# Patient Record
Sex: Female | Born: 1960 | Race: White | Hispanic: No | Marital: Married | Smoking: Never smoker
Health system: Southern US, Community
[De-identification: ages and names within clinical notes are randomized; demographics above are authoritative.]

## PROBLEM LIST (undated history)

## (undated) DIAGNOSIS — M810 Age-related osteoporosis without current pathological fracture: Secondary | ICD-10-CM

## (undated) DIAGNOSIS — F419 Anxiety disorder, unspecified: Secondary | ICD-10-CM

## (undated) DIAGNOSIS — J302 Other seasonal allergic rhinitis: Secondary | ICD-10-CM

## (undated) DIAGNOSIS — K219 Gastro-esophageal reflux disease without esophagitis: Secondary | ICD-10-CM

## (undated) HISTORY — DX: Other seasonal allergic rhinitis: J30.2

## (undated) HISTORY — DX: Age-related osteoporosis without current pathological fracture: M81.0

## (undated) HISTORY — DX: Anxiety disorder, unspecified: F41.9

## (undated) HISTORY — DX: Gastro-esophageal reflux disease without esophagitis: K21.9

---

## 1995-06-09 HISTORY — PX: TUBAL LIGATION: SHX77

## 1998-01-29 ENCOUNTER — Other Ambulatory Visit: Admission: RE | Admit: 1998-01-29 | Discharge: 1998-01-29 | Payer: Self-pay | Admitting: *Deleted

## 1999-02-03 ENCOUNTER — Other Ambulatory Visit: Admission: RE | Admit: 1999-02-03 | Discharge: 1999-02-03 | Payer: Self-pay | Admitting: *Deleted

## 2000-02-10 ENCOUNTER — Other Ambulatory Visit: Admission: RE | Admit: 2000-02-10 | Discharge: 2000-02-10 | Payer: Self-pay | Admitting: *Deleted

## 2001-02-10 ENCOUNTER — Other Ambulatory Visit: Admission: RE | Admit: 2001-02-10 | Discharge: 2001-02-10 | Payer: Self-pay | Admitting: *Deleted

## 2002-03-10 ENCOUNTER — Other Ambulatory Visit: Admission: RE | Admit: 2002-03-10 | Discharge: 2002-03-10 | Payer: Self-pay | Admitting: Obstetrics and Gynecology

## 2002-03-30 ENCOUNTER — Encounter: Payer: Self-pay | Admitting: Obstetrics and Gynecology

## 2002-03-30 ENCOUNTER — Ambulatory Visit (HOSPITAL_COMMUNITY): Admission: RE | Admit: 2002-03-30 | Discharge: 2002-03-30 | Payer: Self-pay | Admitting: Obstetrics and Gynecology

## 2003-04-02 ENCOUNTER — Encounter: Payer: Self-pay | Admitting: Obstetrics and Gynecology

## 2003-04-02 ENCOUNTER — Ambulatory Visit (HOSPITAL_COMMUNITY): Admission: RE | Admit: 2003-04-02 | Discharge: 2003-04-02 | Payer: Self-pay | Admitting: Obstetrics and Gynecology

## 2003-04-12 ENCOUNTER — Other Ambulatory Visit: Admission: RE | Admit: 2003-04-12 | Discharge: 2003-04-12 | Payer: Self-pay | Admitting: Obstetrics and Gynecology

## 2004-05-05 ENCOUNTER — Ambulatory Visit (HOSPITAL_COMMUNITY): Admission: RE | Admit: 2004-05-05 | Discharge: 2004-05-05 | Payer: Self-pay | Admitting: Obstetrics and Gynecology

## 2004-05-16 ENCOUNTER — Encounter: Admission: RE | Admit: 2004-05-16 | Discharge: 2004-05-16 | Payer: Self-pay | Admitting: Obstetrics and Gynecology

## 2005-06-12 ENCOUNTER — Ambulatory Visit (HOSPITAL_COMMUNITY): Admission: RE | Admit: 2005-06-12 | Discharge: 2005-06-12 | Payer: Self-pay | Admitting: Obstetrics and Gynecology

## 2006-06-14 ENCOUNTER — Ambulatory Visit (HOSPITAL_COMMUNITY): Admission: RE | Admit: 2006-06-14 | Discharge: 2006-06-14 | Payer: Self-pay | Admitting: Obstetrics and Gynecology

## 2007-06-23 ENCOUNTER — Encounter: Admission: RE | Admit: 2007-06-23 | Discharge: 2007-06-23 | Payer: Self-pay | Admitting: Obstetrics and Gynecology

## 2008-06-25 ENCOUNTER — Ambulatory Visit (HOSPITAL_BASED_OUTPATIENT_CLINIC_OR_DEPARTMENT_OTHER): Admission: RE | Admit: 2008-06-25 | Discharge: 2008-06-25 | Payer: Self-pay | Admitting: Obstetrics and Gynecology

## 2008-06-26 ENCOUNTER — Ambulatory Visit: Payer: Self-pay | Admitting: Diagnostic Radiology

## 2009-06-27 ENCOUNTER — Ambulatory Visit (HOSPITAL_BASED_OUTPATIENT_CLINIC_OR_DEPARTMENT_OTHER)
Admission: RE | Admit: 2009-06-27 | Discharge: 2009-06-27 | Payer: Self-pay | Source: Home / Self Care | Admitting: Obstetrics and Gynecology

## 2009-06-27 ENCOUNTER — Ambulatory Visit: Payer: Self-pay | Admitting: Diagnostic Radiology

## 2010-06-28 ENCOUNTER — Encounter: Payer: Self-pay | Admitting: Obstetrics and Gynecology

## 2010-07-03 ENCOUNTER — Ambulatory Visit (INDEPENDENT_AMBULATORY_CARE_PROVIDER_SITE_OTHER)
Admission: RE | Admit: 2010-07-03 | Discharge: 2010-07-03 | Payer: Self-pay | Source: Home / Self Care | Attending: Obstetrics and Gynecology | Admitting: Obstetrics and Gynecology

## 2010-07-03 DIAGNOSIS — Z1231 Encounter for screening mammogram for malignant neoplasm of breast: Secondary | ICD-10-CM

## 2011-06-17 ENCOUNTER — Other Ambulatory Visit (HOSPITAL_BASED_OUTPATIENT_CLINIC_OR_DEPARTMENT_OTHER): Payer: Self-pay | Admitting: Obstetrics and Gynecology

## 2011-06-17 DIAGNOSIS — Z1231 Encounter for screening mammogram for malignant neoplasm of breast: Secondary | ICD-10-CM

## 2011-07-08 ENCOUNTER — Ambulatory Visit (HOSPITAL_BASED_OUTPATIENT_CLINIC_OR_DEPARTMENT_OTHER)
Admission: RE | Admit: 2011-07-08 | Discharge: 2011-07-08 | Disposition: A | Discharge status: Home / Self Care | Payer: BC Managed Care – PPO | Source: Ambulatory Visit | Attending: Obstetrics and Gynecology | Admitting: Obstetrics and Gynecology

## 2011-07-08 DIAGNOSIS — Z1231 Encounter for screening mammogram for malignant neoplasm of breast: Secondary | ICD-10-CM

## 2012-06-07 ENCOUNTER — Other Ambulatory Visit (HOSPITAL_BASED_OUTPATIENT_CLINIC_OR_DEPARTMENT_OTHER): Payer: Self-pay | Admitting: Family Medicine

## 2012-06-07 DIAGNOSIS — Z1231 Encounter for screening mammogram for malignant neoplasm of breast: Secondary | ICD-10-CM

## 2012-07-08 ENCOUNTER — Ambulatory Visit (HOSPITAL_BASED_OUTPATIENT_CLINIC_OR_DEPARTMENT_OTHER)
Admission: RE | Admit: 2012-07-08 | Discharge: 2012-07-08 | Disposition: A | Discharge status: Home / Self Care | Payer: BC Managed Care – PPO | Source: Ambulatory Visit | Attending: Family Medicine | Admitting: Family Medicine

## 2012-07-08 DIAGNOSIS — Z1231 Encounter for screening mammogram for malignant neoplasm of breast: Secondary | ICD-10-CM

## 2013-06-19 ENCOUNTER — Other Ambulatory Visit (HOSPITAL_BASED_OUTPATIENT_CLINIC_OR_DEPARTMENT_OTHER): Payer: Self-pay | Admitting: Family Medicine

## 2013-06-19 DIAGNOSIS — Z1231 Encounter for screening mammogram for malignant neoplasm of breast: Secondary | ICD-10-CM

## 2013-07-12 ENCOUNTER — Ambulatory Visit (HOSPITAL_BASED_OUTPATIENT_CLINIC_OR_DEPARTMENT_OTHER)
Admission: RE | Admit: 2013-07-12 | Discharge: 2013-07-12 | Disposition: A | Discharge status: Home / Self Care | Payer: BC Managed Care – PPO | Source: Ambulatory Visit | Attending: Family Medicine | Admitting: Family Medicine

## 2013-07-12 DIAGNOSIS — Z1231 Encounter for screening mammogram for malignant neoplasm of breast: Secondary | ICD-10-CM | POA: Insufficient documentation

## 2014-06-12 ENCOUNTER — Other Ambulatory Visit (HOSPITAL_BASED_OUTPATIENT_CLINIC_OR_DEPARTMENT_OTHER): Payer: Self-pay | Admitting: Family Medicine

## 2014-06-12 DIAGNOSIS — Z1231 Encounter for screening mammogram for malignant neoplasm of breast: Secondary | ICD-10-CM

## 2014-07-27 ENCOUNTER — Ambulatory Visit (HOSPITAL_BASED_OUTPATIENT_CLINIC_OR_DEPARTMENT_OTHER)
Admission: RE | Admit: 2014-07-27 | Discharge: 2014-07-27 | Disposition: A | Discharge status: Home / Self Care | Payer: BLUE CROSS/BLUE SHIELD | Source: Ambulatory Visit | Attending: Family Medicine | Admitting: Family Medicine

## 2014-07-27 DIAGNOSIS — Z1231 Encounter for screening mammogram for malignant neoplasm of breast: Secondary | ICD-10-CM | POA: Diagnosis present

## 2014-07-27 DIAGNOSIS — R928 Other abnormal and inconclusive findings on diagnostic imaging of breast: Secondary | ICD-10-CM | POA: Diagnosis not present

## 2014-07-30 ENCOUNTER — Other Ambulatory Visit: Payer: Self-pay | Admitting: Family Medicine

## 2014-07-30 DIAGNOSIS — R928 Other abnormal and inconclusive findings on diagnostic imaging of breast: Secondary | ICD-10-CM

## 2014-08-06 ENCOUNTER — Other Ambulatory Visit: Payer: Self-pay | Admitting: Family Medicine

## 2014-08-06 ENCOUNTER — Ambulatory Visit
Admission: RE | Admit: 2014-08-06 | Discharge: 2014-08-06 | Disposition: A | Discharge status: Home / Self Care | Payer: BLUE CROSS/BLUE SHIELD | Source: Ambulatory Visit | Attending: Family Medicine | Admitting: Family Medicine

## 2014-08-06 DIAGNOSIS — R928 Other abnormal and inconclusive findings on diagnostic imaging of breast: Secondary | ICD-10-CM

## 2015-01-02 ENCOUNTER — Other Ambulatory Visit: Payer: Self-pay | Admitting: Family Medicine

## 2015-01-02 DIAGNOSIS — N6489 Other specified disorders of breast: Secondary | ICD-10-CM

## 2015-02-05 ENCOUNTER — Other Ambulatory Visit: Payer: BLUE CROSS/BLUE SHIELD

## 2015-03-12 ENCOUNTER — Ambulatory Visit
Admission: RE | Admit: 2015-03-12 | Discharge: 2015-03-12 | Disposition: A | Discharge status: Home / Self Care | Payer: BLUE CROSS/BLUE SHIELD | Source: Ambulatory Visit | Attending: Family Medicine | Admitting: Family Medicine

## 2015-03-12 DIAGNOSIS — N6489 Other specified disorders of breast: Secondary | ICD-10-CM

## 2015-07-23 ENCOUNTER — Other Ambulatory Visit (HOSPITAL_BASED_OUTPATIENT_CLINIC_OR_DEPARTMENT_OTHER): Payer: Self-pay | Admitting: Obstetrics and Gynecology

## 2015-07-23 DIAGNOSIS — Z1231 Encounter for screening mammogram for malignant neoplasm of breast: Secondary | ICD-10-CM

## 2015-07-30 ENCOUNTER — Ambulatory Visit (HOSPITAL_BASED_OUTPATIENT_CLINIC_OR_DEPARTMENT_OTHER)
Admission: RE | Admit: 2015-07-30 | Discharge: 2015-07-30 | Disposition: A | Discharge status: Home / Self Care | Payer: BLUE CROSS/BLUE SHIELD | Source: Ambulatory Visit | Attending: Obstetrics and Gynecology | Admitting: Obstetrics and Gynecology

## 2015-07-30 DIAGNOSIS — Z1231 Encounter for screening mammogram for malignant neoplasm of breast: Secondary | ICD-10-CM | POA: Diagnosis not present

## 2016-06-30 ENCOUNTER — Other Ambulatory Visit (HOSPITAL_BASED_OUTPATIENT_CLINIC_OR_DEPARTMENT_OTHER): Payer: Self-pay | Admitting: Obstetrics and Gynecology

## 2016-06-30 DIAGNOSIS — Z1231 Encounter for screening mammogram for malignant neoplasm of breast: Secondary | ICD-10-CM

## 2016-07-30 ENCOUNTER — Ambulatory Visit (HOSPITAL_BASED_OUTPATIENT_CLINIC_OR_DEPARTMENT_OTHER)
Admission: RE | Admit: 2016-07-30 | Discharge: 2016-07-30 | Disposition: A | Discharge status: Home / Self Care | Payer: BLUE CROSS/BLUE SHIELD | Source: Ambulatory Visit | Attending: Obstetrics and Gynecology | Admitting: Obstetrics and Gynecology

## 2016-07-30 ENCOUNTER — Encounter (HOSPITAL_BASED_OUTPATIENT_CLINIC_OR_DEPARTMENT_OTHER): Payer: Self-pay

## 2016-07-30 DIAGNOSIS — R928 Other abnormal and inconclusive findings on diagnostic imaging of breast: Secondary | ICD-10-CM | POA: Insufficient documentation

## 2016-07-30 DIAGNOSIS — Z1231 Encounter for screening mammogram for malignant neoplasm of breast: Secondary | ICD-10-CM | POA: Diagnosis present

## 2016-08-04 ENCOUNTER — Other Ambulatory Visit: Payer: Self-pay | Admitting: Obstetrics and Gynecology

## 2016-08-04 DIAGNOSIS — R928 Other abnormal and inconclusive findings on diagnostic imaging of breast: Secondary | ICD-10-CM

## 2016-08-06 ENCOUNTER — Ambulatory Visit
Admission: RE | Admit: 2016-08-06 | Discharge: 2016-08-06 | Disposition: A | Discharge status: Home / Self Care | Payer: BLUE CROSS/BLUE SHIELD | Source: Ambulatory Visit | Attending: Obstetrics and Gynecology | Admitting: Obstetrics and Gynecology

## 2016-08-06 DIAGNOSIS — R928 Other abnormal and inconclusive findings on diagnostic imaging of breast: Secondary | ICD-10-CM

## 2017-08-09 ENCOUNTER — Other Ambulatory Visit: Payer: Self-pay | Admitting: Obstetrics and Gynecology

## 2017-08-09 DIAGNOSIS — Z1231 Encounter for screening mammogram for malignant neoplasm of breast: Secondary | ICD-10-CM

## 2017-08-26 ENCOUNTER — Ambulatory Visit
Admission: RE | Admit: 2017-08-26 | Discharge: 2017-08-26 | Disposition: A | Discharge status: Home / Self Care | Payer: BLUE CROSS/BLUE SHIELD | Source: Ambulatory Visit | Attending: Obstetrics and Gynecology | Admitting: Obstetrics and Gynecology

## 2017-08-26 DIAGNOSIS — Z1231 Encounter for screening mammogram for malignant neoplasm of breast: Secondary | ICD-10-CM

## 2018-07-26 ENCOUNTER — Other Ambulatory Visit: Payer: Self-pay | Admitting: Obstetrics and Gynecology

## 2018-07-26 DIAGNOSIS — Z1231 Encounter for screening mammogram for malignant neoplasm of breast: Secondary | ICD-10-CM

## 2018-08-30 ENCOUNTER — Ambulatory Visit: Payer: BLUE CROSS/BLUE SHIELD

## 2018-09-28 ENCOUNTER — Ambulatory Visit: Payer: BLUE CROSS/BLUE SHIELD

## 2018-11-24 ENCOUNTER — Other Ambulatory Visit: Payer: Self-pay

## 2018-11-24 ENCOUNTER — Ambulatory Visit
Admission: RE | Admit: 2018-11-24 | Discharge: 2018-11-24 | Disposition: A | Discharge status: Home / Self Care | Payer: BC Managed Care – PPO | Source: Ambulatory Visit | Attending: Obstetrics and Gynecology | Admitting: Obstetrics and Gynecology

## 2018-11-24 DIAGNOSIS — Z1231 Encounter for screening mammogram for malignant neoplasm of breast: Secondary | ICD-10-CM

## 2019-09-02 ENCOUNTER — Ambulatory Visit: Payer: Self-pay | Attending: Internal Medicine

## 2019-09-02 DIAGNOSIS — Z23 Encounter for immunization: Secondary | ICD-10-CM

## 2019-09-02 NOTE — Progress Notes (Signed)
   Covid-19 Vaccination Clinic  Name:  Samantha Wiley    MRN: [id number] DOB: 05/07/1961  09/02/2019  [name] was observed post Covid-19 immunization for 15 minutes without incident. She was provided with Vaccine Information Sheet and instruction to access the V-Safe system.   [name] was instructed to call 911 with any severe reactions post vaccine: Marland Kitchen Difficulty breathing  . Swelling of face and throat  . A fast heartbeat  . A bad rash all over body  . Dizziness and weakness   Immunizations Administered    Name Date Dose VIS Date Route   Pfizer COVID-19 Vaccine 09/02/2019  9:44 AM 0.3 mL 05/19/2019 Intramuscular   Manufacturer: [place]   Lot: [id number]   [place]: [id number]

## 2019-09-21 ENCOUNTER — Other Ambulatory Visit: Payer: Self-pay | Admitting: Obstetrics and Gynecology

## 2019-09-21 DIAGNOSIS — E2839 Other primary ovarian failure: Secondary | ICD-10-CM

## 2019-09-26 ENCOUNTER — Ambulatory Visit: Payer: Self-pay | Attending: Internal Medicine

## 2019-09-26 DIAGNOSIS — Z23 Encounter for immunization: Secondary | ICD-10-CM

## 2019-09-26 NOTE — Progress Notes (Signed)
   Covid-19 Vaccination Clinic  Name:  Ailana Ganske    MRN: [id number] DOB: August 26, 1960  09/26/2019  Ms. Giuffrida was observed post Covid-19 immunization for 15 minutes without incident. She was provided with Vaccine Information Sheet and instruction to access the V-Safe system.   Ms. Highland was instructed to call 911 with any severe reactions post vaccine: Marland Kitchen Difficulty breathing  . Swelling of face and throat  . A fast heartbeat  . A bad rash all over body  . Dizziness and weakness   Immunizations Administered    Name Date Dose VIS Date Route   Pfizer COVID-19 Vaccine 09/26/2019  4:11 PM 0.3 mL 08/02/2018 Intramuscular   Manufacturer: [place]   Lot: [id number]   [place]: [id number]

## 2019-10-09 ENCOUNTER — Other Ambulatory Visit: Payer: Self-pay | Admitting: Obstetrics and Gynecology

## 2019-10-09 DIAGNOSIS — Z1231 Encounter for screening mammogram for malignant neoplasm of breast: Secondary | ICD-10-CM

## 2019-12-18 ENCOUNTER — Other Ambulatory Visit: Payer: Self-pay

## 2019-12-18 ENCOUNTER — Ambulatory Visit
Admission: RE | Admit: 2019-12-18 | Discharge: 2019-12-18 | Disposition: A | Discharge status: Home / Self Care | Payer: BC Managed Care – PPO | Source: Ambulatory Visit | Attending: Obstetrics and Gynecology | Admitting: Obstetrics and Gynecology

## 2019-12-18 DIAGNOSIS — Z1231 Encounter for screening mammogram for malignant neoplasm of breast: Secondary | ICD-10-CM

## 2019-12-18 DIAGNOSIS — E2839 Other primary ovarian failure: Secondary | ICD-10-CM

## 2020-11-27 ENCOUNTER — Other Ambulatory Visit: Payer: Self-pay | Admitting: Family Medicine

## 2020-11-27 DIAGNOSIS — Z1231 Encounter for screening mammogram for malignant neoplasm of breast: Secondary | ICD-10-CM

## 2021-01-20 ENCOUNTER — Ambulatory Visit
Admission: RE | Admit: 2021-01-20 | Discharge: 2021-01-20 | Disposition: A | Discharge status: Home / Self Care | Payer: BLUE CROSS/BLUE SHIELD | Source: Ambulatory Visit | Attending: Family Medicine | Admitting: Family Medicine

## 2021-01-20 ENCOUNTER — Other Ambulatory Visit: Payer: Self-pay

## 2021-01-20 DIAGNOSIS — Z1231 Encounter for screening mammogram for malignant neoplasm of breast: Secondary | ICD-10-CM

## 2021-09-06 DIAGNOSIS — I639 Cerebral infarction, unspecified: Secondary | ICD-10-CM

## 2021-09-06 HISTORY — DX: Cerebral infarction, unspecified: I63.9

## 2021-11-12 ENCOUNTER — Encounter: Payer: Self-pay | Admitting: Neurology

## 2021-12-25 NOTE — Progress Notes (Signed)
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  She has no history of hypertension and no underlying structural mass lesion or vascular abnormality.  She has been steadily improving.     Subjective:  Samantha Wiley is a 61 year old right-handed female with osteoporosis who presents for stroke.  History supplemented by hospital records and referring provider's notes.  On 09/14/2021, patient was in [place], [place] when she developed headache and speech abnormality.  She has no recollection of the event or the hospital stay.  She was admitted to [facility] where she was found to have a non-traumatic left thalamic/basal ganglia hemorrhage with interventricular hemorrhage requiring intubation an EVD placement and subsequent removal on 4/19.  She has no history of high blood pressure (she actually has low blood pressure) and there is no mention in available notes that she presented with elevated blood pressure.  MRI of brain performed in hospital revealed no underlying lesion but was performed without contrast.  She was discharged on 10/02/2021 to [facility] where she stayed until 10/12/2021.  She followed up with local outpatient neurosurgery on 10/15/2021 a day after repeat head CT which showed unchanged hypodensity in the left thalamus, normal extra-axial spaces and a small calcification along the external ventricular drain tract with right frontal approach but no mass, hemorrhage or midline shift.  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  If she closes her left eye, vision is clear but if she just closes her right eye, still looks a little  blurred.  With eye movements up or side to side, notes double vision.  She saw neuro-ophthalmology who told her that her vision will eventually resolve.  She had a follow up outpatient MRI of brain with and without contrast on 12/18/2021 which showed sequela of prior left thalamic hemorrhage without underlying lesion.  She noticed mild left sided facial tingling in the V2 distribution.  She also continues to feel unsteady on her feet and dizzy.  She is not driving.   09/14/2021 CT HEAD WO:  Left thalamic intraparenchymal hemorrhage with extension into the ventricles.  This extends down to the 4th ventricle with ventricular enlargement. 09/17/2021 MRI BRAIN WO:  Redemonstrated findings from CT, as above. 09/23/2021 CT HEAD WO:  Continued evolution of the left thalamic intraparenchymal hemorrhage.  Resolved subarachnoid hemorrhage.  09/24/2021 CT HEAD WO:  Continued left thalamic intraparenchymal hemorrhagic evolution, similar ventricular size with small volume intraventricular blood.  No hydrocephalus with EVD in position.  No new hemorrhage.  09/29/2021 CT HEAD WO:  Continued left thalamic intraparenchymal hemorrhagic evolusion.  Similar ventricular size status post EVD removal.  No new hemorrhage or other acute interval finding. 12/18/2021 MRI BRAIN W WO:  1. Sequela of prior left thalamic hemorrhage. No underlying lesion identified. 2. 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   PAST MEDICAL HISTORY: No past medical history on file.   PAST SURGICAL HISTORY: No past surgical history on file.  MEDICATIONS: Current Outpatient Medications on File Prior to Visit  Medication Sig Dispense Refill   Ascorbic Acid (VITAMIN C) 100 MG CHEW      Cholecalciferol (VITAMIN D-1000 MAX ST) 25 MCG (  1000 UT) tablet Take by mouth.     denosumab (PROLIA) 60 MG/ML SOSY injection Inject 60 mg into the skin every 6 (six) months.      fluticasone (FLONASE) 50 MCG/ACT nasal spray Place into both nostrils.     Magnesium 125 MG CAPS      Multiple Vitamins-Minerals (MULTI ADULT GUMMIES) CHEW      omeprazole (PRILOSEC) 20 MG capsule Take 20 mg by mouth daily.     vitamin B-12 (CYANOCOBALAMIN) 1000 MCG tablet Take 1 tablet by mouth daily.     No current facility-administered medications on file prior to visit.     ALLERGIES: No Known Allergies  FAMILY HISTORY: Family History  Problem Relation Age of Onset   Stroke Maternal Grandfather    Stroke Paternal Grandmother     Objective:  Blood pressure 118/70, pulse 81, height '5\' 5"'$  (1.651 m), weight 97 lb 9.6 oz (44.3 kg), SpO2 98 %. General: No acute distress.  Patient appears well-groomed.   Head:  Normocephalic/atraumatic Eyes:  fundi examined but not visualized Neck: supple, no paraspinal tenderness, full range of motion Back: No paraspinal tenderness Heart: regular rate and rhythm Lungs: Clear to auscultation bilaterally. Vascular: No carotid bruits. Neurological Exam: Mental status: alert and oriented to person, place, and time, speech fluent and not dysarthric, language intact. Cranial nerves: CN I: not tested CN II: pupils equal, round and reactive to light, visual fields intact CN III, IV, VI:  full range of motion, no nystagmus, no ptosis CN V: facial sensation intact. CN VII: upper and lower face symmetric CN VIII: hearing intact CN IX, X: gag intact, uvula midline CN XI: sternocleidomastoid and trapezius muscles intact CN XII: tongue midline Bulk & Tone: normal, no fasciculations. Motor:  muscle strength 5/5 throughout Sensation:  Pinprick, temperature and vibratory sensation intact. Deep Tendon Reflexes:  2+ throughout,  toes downgoing.   Finger to nose testing:  Without dysmetria.   Heel to shin:  Without dysmetria.   Gait:  Normal station and stride.  Romberg negative.    Thank you for allowing me to take part in the care of this  patient.  Metta Clines, DO  CC: Blenda Mounts, MD

## 2021-12-29 ENCOUNTER — Encounter: Payer: Self-pay | Admitting: Neurology

## 2021-12-29 ENCOUNTER — Ambulatory Visit: Payer: BC Managed Care – PPO | Admitting: Neurology

## 2021-12-29 VITALS — BP 118/70 | HR 81 | Ht 65.0 in | Wt 97.6 lb

## 2021-12-29 DIAGNOSIS — I619 Nontraumatic intracerebral hemorrhage, unspecified: Secondary | ICD-10-CM

## 2022-03-18 ENCOUNTER — Other Ambulatory Visit: Payer: Self-pay | Admitting: Family Medicine

## 2022-03-18 DIAGNOSIS — Z1231 Encounter for screening mammogram for malignant neoplasm of breast: Secondary | ICD-10-CM

## 2022-04-02 ENCOUNTER — Encounter: Payer: Self-pay | Admitting: Gastroenterology

## 2022-04-16 ENCOUNTER — Ambulatory Visit (AMBULATORY_SURGERY_CENTER): Payer: Self-pay

## 2022-04-16 VITALS — Ht 65.0 in | Wt 98.0 lb

## 2022-04-16 DIAGNOSIS — R195 Other fecal abnormalities: Secondary | ICD-10-CM

## 2022-04-16 DIAGNOSIS — Z1211 Encounter for screening for malignant neoplasm of colon: Secondary | ICD-10-CM

## 2022-04-16 MED ORDER — NA SULFATE-K SULFATE-MG SULF 17.5-3.13-1.6 GM/177ML PO SOLN: 1.0000 | Freq: Once | ORAL | 0 refills | Status: AC

## 2022-04-16 NOTE — Progress Notes (Signed)
No egg or soy allergy known to patient  No issues known to pt with past sedation with any surgeries or procedures Patient denies ever being told they had issues or difficulty with intubation;  No FH of Malignant Hyperthermia; Pt is not on diet pills; Pt is not on home 02;  Pt is not on blood thinners;  Pt reports issues with constipation; patient reports she eats more fruits and veggies and will either take an OTC med to relief of constipation- patient aware she has hemorrhoids; patient advised to increase oral fluids and to take Miralax or stool softeners if needed;  No A fib or A flutter; Have any cardiac testing pending--NO Pt instructed to use Singlecare.com or GoodRx for a price reduction on prep;   Insurance verified during [place] appt=BCBS PPO  Patient's chart reviewed by Osvaldo Angst CNRA prior to previsit and patient appropriate for the [place].  Previsit completed and red dot placed by patient's name on their procedure day (on provider's schedule).    GoodRx coupon given to the patient during [facility] appt;

## 2022-04-22 ENCOUNTER — Ambulatory Visit
Admission: RE | Admit: 2022-04-22 | Discharge: 2022-04-22 | Disposition: A | Discharge status: Home / Self Care | Payer: BC Managed Care – PPO | Source: Ambulatory Visit | Attending: Family Medicine | Admitting: Family Medicine

## 2022-04-22 DIAGNOSIS — Z1231 Encounter for screening mammogram for malignant neoplasm of breast: Secondary | ICD-10-CM

## 2022-04-29 ENCOUNTER — Encounter: Payer: Self-pay | Admitting: Gastroenterology

## 2022-05-03 ENCOUNTER — Encounter: Payer: Self-pay | Admitting: Certified Registered Nurse Anesthetist

## 2022-05-07 ENCOUNTER — Encounter: Payer: Self-pay | Admitting: Gastroenterology

## 2022-05-07 ENCOUNTER — Ambulatory Visit (AMBULATORY_SURGERY_CENTER): Payer: BC Managed Care – PPO | Admitting: Gastroenterology

## 2022-05-07 VITALS — BP 105/70 | HR 75 | Temp 97.8°F | Resp 12 | Ht 65.0 in | Wt 98.0 lb

## 2022-05-07 DIAGNOSIS — D123 Benign neoplasm of transverse colon: Secondary | ICD-10-CM

## 2022-05-07 DIAGNOSIS — D120 Benign neoplasm of cecum: Secondary | ICD-10-CM | POA: Diagnosis not present

## 2022-05-07 DIAGNOSIS — R195 Other fecal abnormalities: Secondary | ICD-10-CM

## 2022-05-07 MED ORDER — SODIUM CHLORIDE 0.9 % IV SOLN: 500.0000 mL | INTRAVENOUS | Status: DC

## 2022-05-07 NOTE — Op Note (Signed)
[place] Patient Name: Samantha Wiley Procedure Date: 05/07/2022 9:32 AM MRN: [id number] Endoscopist: Mallie Mussel L. Loletha Carrow , MD, [id number] Age: 61 Referring MD:  Date of Birth: 12/30/1960 Gender: Female Account #: [id number] Procedure:                Colonoscopy Indications:              Positive Cologuard test Medicines:                Monitored Anesthesia Care Procedure:                Pre-Anesthesia Assessment:                           - Prior to the procedure, a History and Physical                            was performed, and patient medications and                            allergies were reviewed. The patient's tolerance of                            previous anesthesia was also reviewed. The risks                            and benefits of the procedure and the sedation                            options and risks were discussed with the patient.                            All questions were answered, and informed consent                            was obtained. Prior Anticoagulants: The patient has                            taken no anticoagulant or antiplatelet agents. ASA                            Grade Assessment: II - A patient with mild systemic                            disease. After reviewing the risks and benefits,                            the patient was deemed in satisfactory condition to                            undergo the procedure.                           After obtaining informed consent, the colonoscope  was passed under direct vision. Throughout the                            procedure, the patient's blood pressure, pulse, and                            oxygen saturations were monitored continuously. The                            Olympus PCF-H190DL (#[id number]) Colonoscope was                            introduced through the anus and advanced to the the                            cecum, identified by appendiceal  orifice and                            ileocecal valve. The colonoscopy was somewhat                            difficult due to a redundant colon, a tortuous                            colon and the patient's body habitus (slim and                            petite). Successful completion of the procedure was                            aided by using manual pressure and straightening                            and shortening the scope to obtain bowel loop                            reduction. The patient tolerated the procedure                            well. The quality of the bowel preparation was                            excellent. The ileocecal valve, appendiceal                            orifice, and rectum were photographed. Scope In: 9:42:51 AM Scope Out: 10:00:43 AM Scope Withdrawal Time: 0 hours 13 minutes 24 seconds  Total Procedure Duration: 0 hours 17 minutes 52 seconds  Findings:                 The perianal and digital rectal examinations were                            normal.  A diminutive polyp was found in the cecum. The                            polyp was semi-sessile. The polyp was removed with                            a cold biopsy forceps. Resection and retrieval were                            complete.                           A diminutive polyp was found in the proximal                            transverse colon. The polyp was sessile. The polyp                            was removed with a cold snare. Resection and                            retrieval were complete.                           Repeat examination of right colon under NBI                            performed.                           The exam was otherwise without abnormality on                            direct and retroflexion views. Complications:            No immediate complications. Estimated Blood Loss:     Estimated blood loss was minimal. Impression:                - One diminutive polyp in the cecum, removed with a                            cold biopsy forceps. Resected and retrieved.                           - One diminutive polyp in the proximal transverse                            colon, removed with a cold snare. Resected and                            retrieved.                           - The examination was otherwise normal on direct  and retroflexion views. Recommendation:           - Patient has a contact number available for                            emergencies. The signs and symptoms of potential                            delayed complications were discussed with the                            patient. Return to normal activities tomorrow.                            Written discharge instructions were provided to the                            patient.                           - Resume previous diet.                           - Continue present medications.                           - Await pathology results.                           - Repeat colonoscopy is recommended for                            surveillance. The colonoscopy date will be                            determined after pathology results from today's                            exam become available for review. Aylssa Herrig L. Loletha Carrow, MD 05/07/2022 10:04:41 AM This report has been signed electronically.

## 2022-05-07 NOTE — Progress Notes (Signed)
Called to room to assist during endoscopic procedure.  Patient ID and intended procedure confirmed with present staff. Received instructions for my participation in the procedure from the performing physician.  

## 2022-05-07 NOTE — Patient Instructions (Signed)
Handouts on polyps given to patient. Await pathology results. Resume previous diet and continue present medications. Repeat colonoscopy for surveillance will be determined based off of pathology results.  YOU HAD AN ENDOSCOPIC PROCEDURE TODAY AT THE [facility]:   Refer to the procedure report that was given to you for any specific questions about what was found during the examination.  If the procedure report does not answer your questions, please call your gastroenterologist to clarify.  If you requested that your care partner not be given the details of your procedure findings, then the procedure report has been included in a sealed envelope for you to review at your convenience later.  YOU SHOULD EXPECT: Some feelings of bloating in the abdomen. Passage of more gas than usual.  Walking can help get rid of the air that was put into your GI tract during the procedure and reduce the bloating. If you had a lower endoscopy (such as a colonoscopy or flexible sigmoidoscopy) you may notice spotting of blood in your stool or on the toilet paper. If you underwent a bowel prep for your procedure, you may not have a normal bowel movement for a few days.  Please Note:  You might notice some irritation and congestion in your nose or some drainage.  This is from the oxygen used during your procedure.  There is no need for concern and it should clear up in a day or so.  SYMPTOMS TO REPORT IMMEDIATELY:  Following lower endoscopy (colonoscopy or flexible sigmoidoscopy):  Excessive amounts of blood in the stool  Significant tenderness or worsening of abdominal pains  Swelling of the abdomen that is new, acute  Fever of 100F or higher  For urgent or emergent issues, a gastroenterologist can be reached at any hour by calling [phone]. Do not use MyChart messaging for urgent concerns.    DIET:  We do recommend a small meal at first, but then you may proceed to your regular diet.  Drink plenty  of fluids but you should avoid alcoholic beverages for 24 hours.  ACTIVITY:  You should plan to take it easy for the rest of today and you should NOT DRIVE or use heavy machinery until tomorrow (because of the sedation medicines used during the test).    FOLLOW UP: Our staff will call the number listed on your records the next business day following your procedure.  We will call around 7:15- 8:00 am to check on you and address any questions or concerns that you may have regarding the information given to you following your procedure. If we do not reach you, we will leave a message.     If any biopsies were taken you will be contacted by phone or by letter within the next 1-3 weeks.  Please call us at [phone] if you have not heard about the biopsies in 3 weeks.    SIGNATURES/CONFIDENTIALITY: You and/or your care partner have signed paperwork which will be entered into your electronic medical record.  These signatures attest to the fact that that the information above on your After Visit Summary has been reviewed and is understood.  Full responsibility of the confidentiality of this discharge information lies with you and/or your care-partner.  

## 2022-05-07 NOTE — Progress Notes (Signed)
History and Physical:  This patient presents for endoscopic testing for: Encounter Diagnosis  Name Primary?   Positive colorectal cancer screening using Cologuard test Yes   Cologuard positive Feb 2023  Patient denies chronic abdominal pain, rectal bleeding, constipation or diarrhea.   Patient is otherwise without complaints or active issues today.   Past Medical History: Past Medical History:  Diagnosis Date   Anxiety    situational anxiety   GERD (gastroesophageal reflux disease)    OTC PRN meds   Osteoporosis    q2monthProlia inj   Seasonal allergies    Stroke ([place] 09[place]20[place]   subarachnoid hemorrhage-eye sight (blurriness)- not able to drive due to this     Past Surgical History: Past Surgical History:  Procedure Laterality Date   CESAREAN SECTION  1992   and 1[place]   Allergies: No Known Allergies  Outpatient Meds: Current Outpatient Medications  Medication Sig Dispense Refill   acetaminophen (TYLENOL) 500 MG tablet Take 500 mg by mouth every 6 (six) hours as needed for moderate pain.     Ascorbic Acid (VITAMIN C) 100 MG CHEW Chew 282 mg by mouth daily at 6 (six) AM.     calcium carbonate (TUMS - DOSED IN MG ELEMENTAL CALCIUM) 500 MG chewable tablet Chew 1 tablet by mouth daily as needed for indigestion or heartburn.     Cholecalciferol (VITAMIN D-1000 MAX ST) 25 MCG (1000 UT) tablet Take 2 tablets by mouth daily at 6 (six) AM.     denosumab (PROLIA) 60 MG/ML SOSY injection Inject 60 mg into the skin every 6 (six) months.     fluticasone (FLONASE) 50 MCG/ACT nasal spray Place 2 sprays into both nostrils daily.     Magnesium 200 MG TABS Take 1 tablet by mouth daily at 6 (six) AM.     Multiple Vitamins-Minerals (MULTI ADULT GUMMIES) CHEW Chew 1 tablet by mouth daily.     omeprazole (PRILOSEC) 20 MG capsule Take 20 mg by mouth daily as needed.     vitamin B-12 (CYANOCOBALAMIN) 1000 MCG tablet Take 1 tablet by mouth daily.     Current  Facility-Administered Medications  Medication Dose Route Frequency Provider Last Rate Last Admin   0.9 %  sodium chloride infusion  500 mL Intravenous Continuous DNelida MeuseIII, MD          ___________________________________________________________________ Objective   Exam:  BP 110/65   Pulse 87   Temp 97.8 F (36.6 C)   Ht '5\' 5"'$  (1.651 m)   Wt 98 lb (44.5 kg)   SpO2 100%   BMI 16.31 kg/m   CV: regular , S1/S2 Resp: clear to auscultation bilaterally, normal RR and effort noted GI: soft, no tenderness, with active bowel sounds.   Assessment: Encounter Diagnosis  Name Primary?   Positive colorectal cancer screening using Cologuard test Yes     Plan: Colonoscopy  The benefits and risks of the planned procedure were described in detail with the patient or (when appropriate) their health care proxy.  Risks were outlined as including, but not limited to, bleeding, infection, perforation, adverse medication reaction leading to cardiac or pulmonary decompensation, pancreatitis (if ERCP).  The limitation of incomplete mucosal visualization was also discussed.  No guarantees or warranties were given.    The patient is appropriate for an endoscopic procedure in the ambulatory setting.   - HWilfrid Lund MD

## 2022-05-07 NOTE — Progress Notes (Signed)
A/ox3, pleased with MAC, report to RN 

## 2022-05-08 ENCOUNTER — Telehealth: Payer: Self-pay

## 2022-05-08 NOTE — Telephone Encounter (Signed)
Attempted f/u call. No answer, left VM. 

## 2022-05-13 ENCOUNTER — Encounter: Payer: Self-pay | Admitting: Gastroenterology

## 2022-07-01 IMAGING — MG MM DIGITAL SCREENING BILAT W/ TOMO AND CAD
8 series · 9 of 24 positions shown · non-contrast
Comparison: Previous exam(s).

CLINICAL DATA: Screening.

EXAM:
DIGITAL SCREENING BILATERAL MAMMOGRAM WITH TOMOSYNTHESIS AND CAD
TECHNIQUE: Bilateral screening digital craniocaudal and mediolateral oblique
mammograms were obtained. Bilateral screening digital breast
tomosynthesis was performed. The images were evaluated with
computer-aided detection.

[L CC synth-2D]
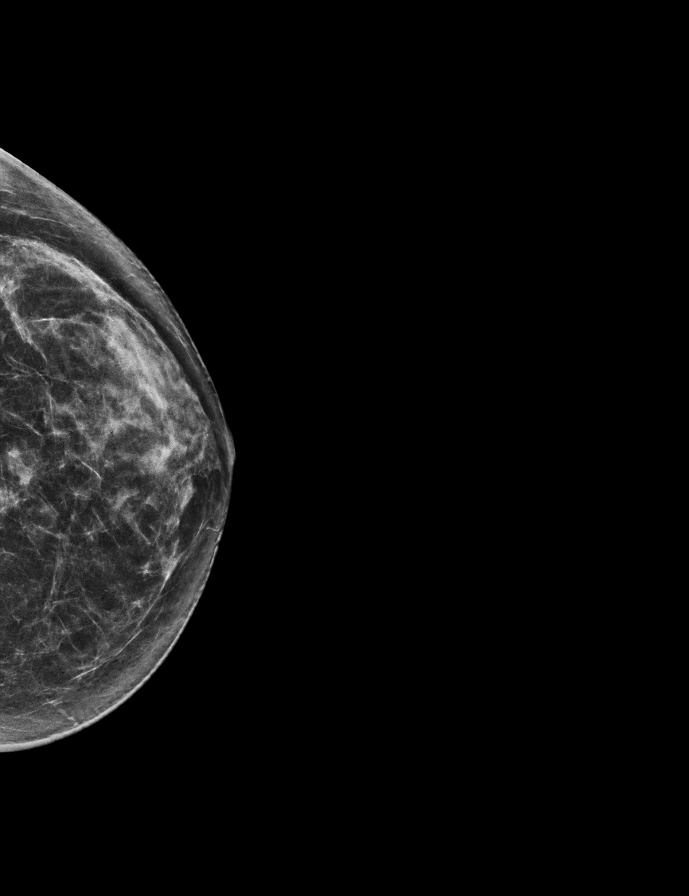

[R MLO synth-2D]
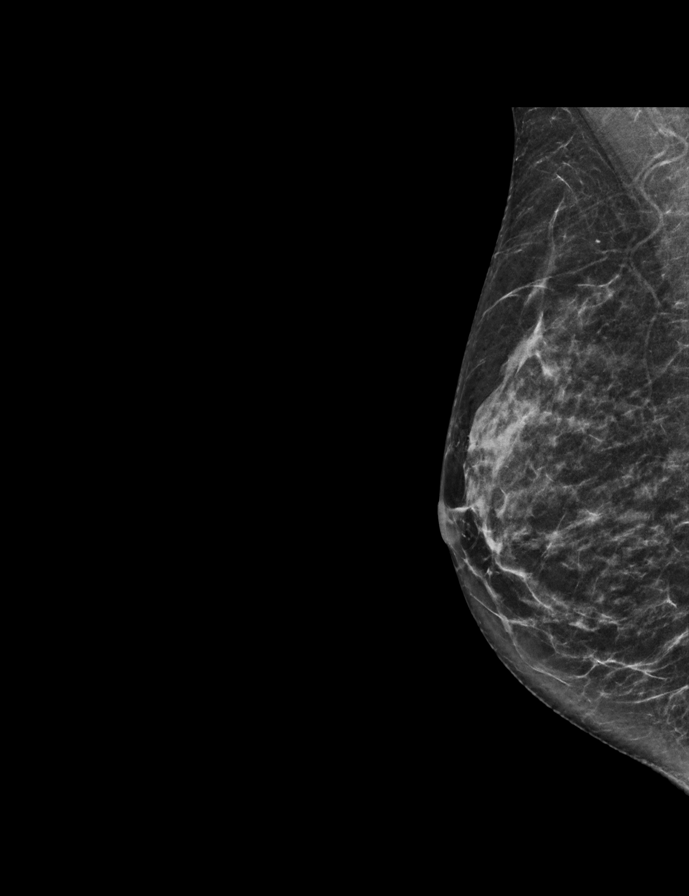

[R CC synth-2D]
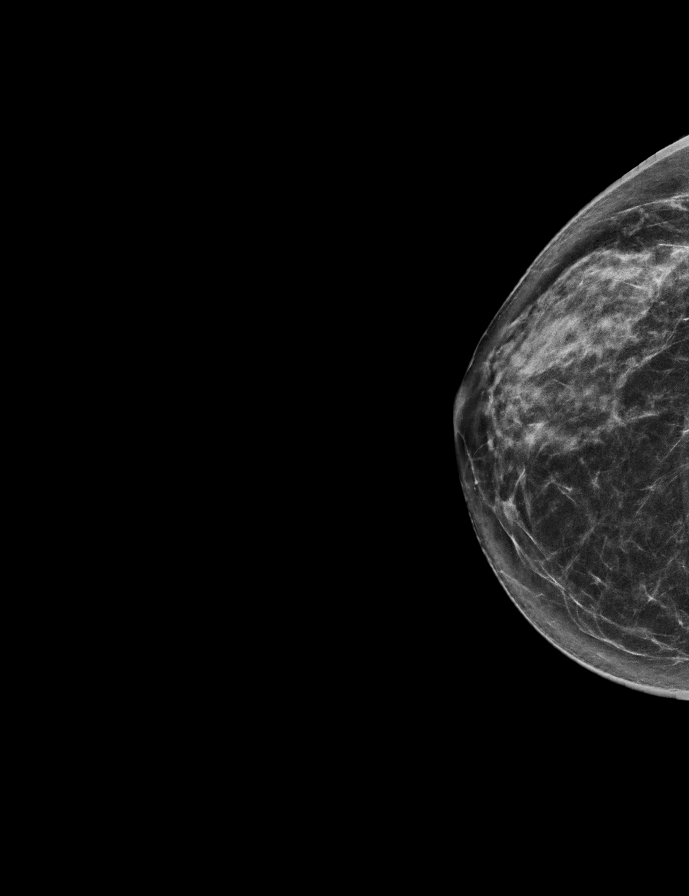

[L MLO synth-2D]
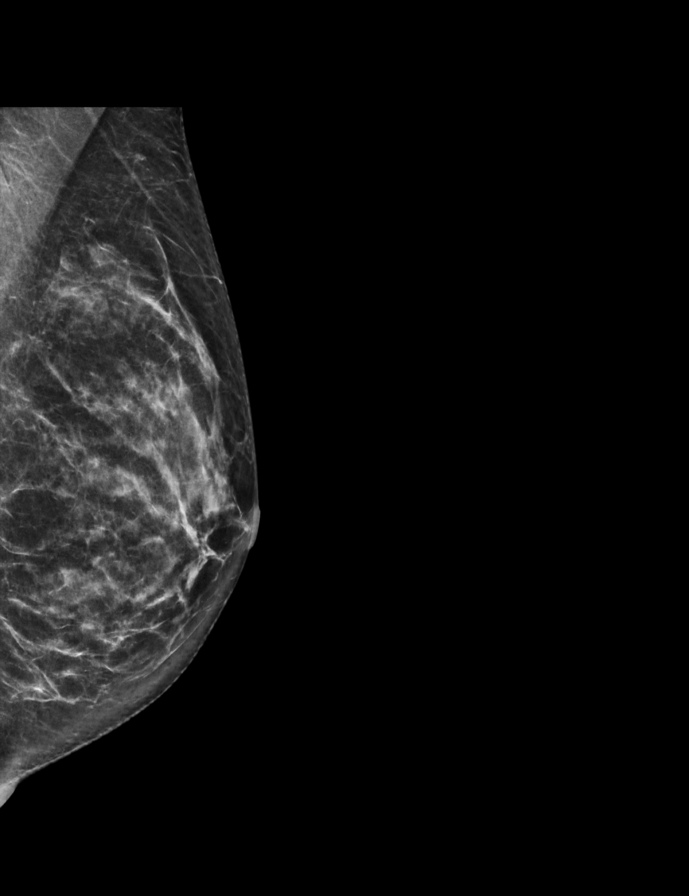

[R CC tomo · 2 of 63 frames shown]
[frame 21/63]
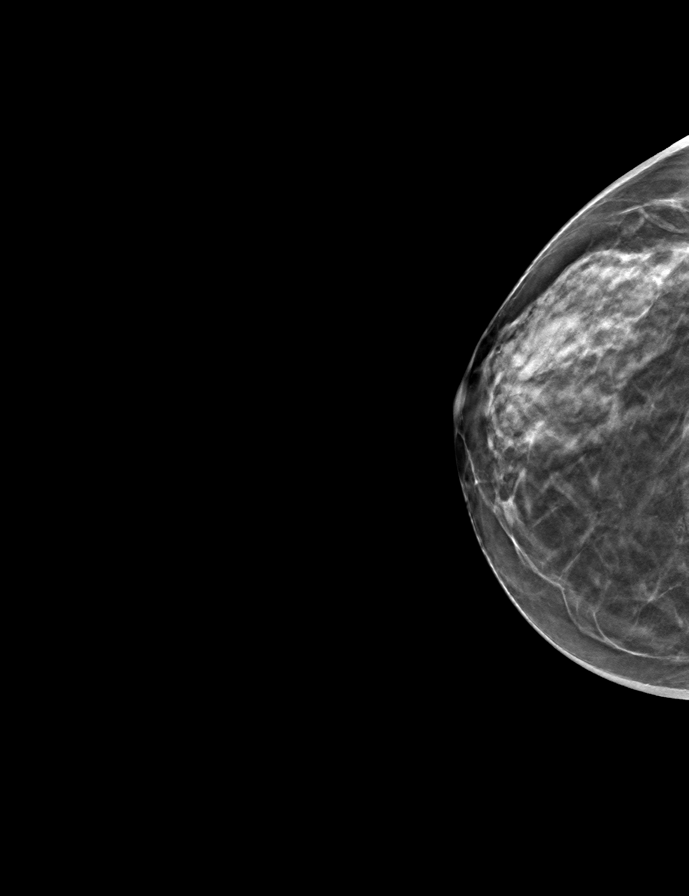
[frame 32/63]
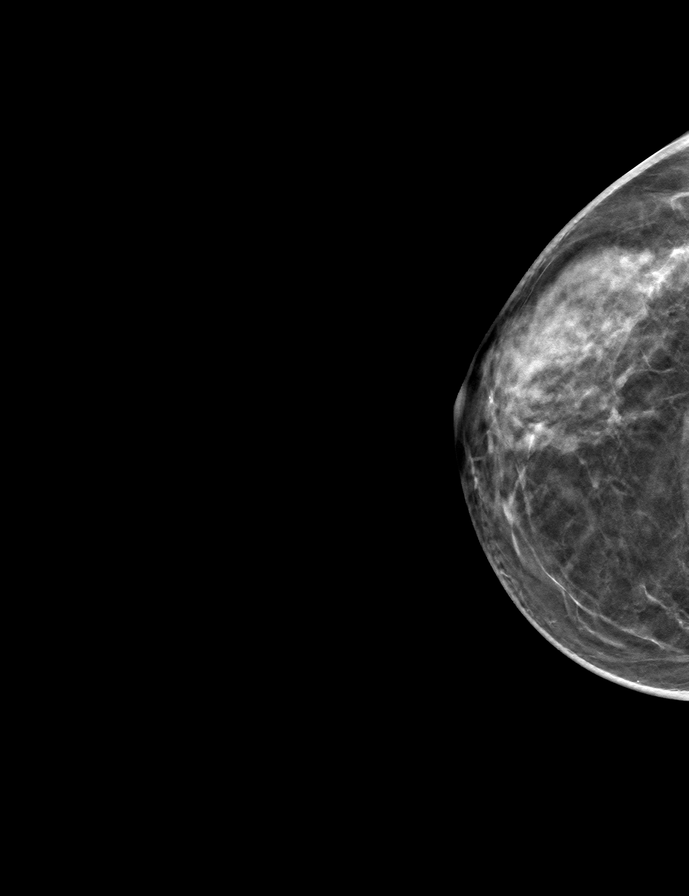

[L CC tomo · tomo slice 33/64.0]
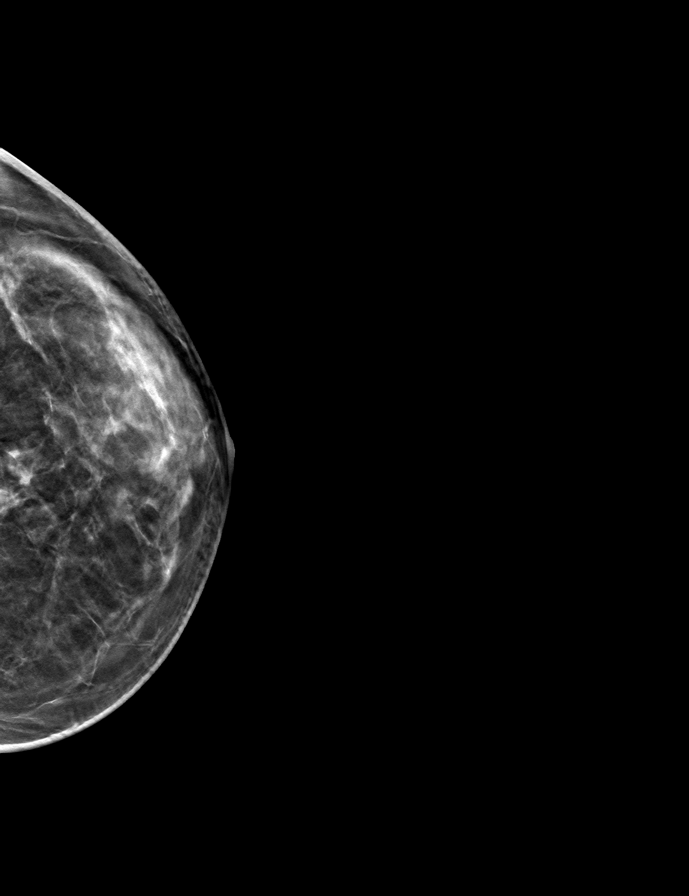

[R MLO tomo · tomo slice 29/56.0]
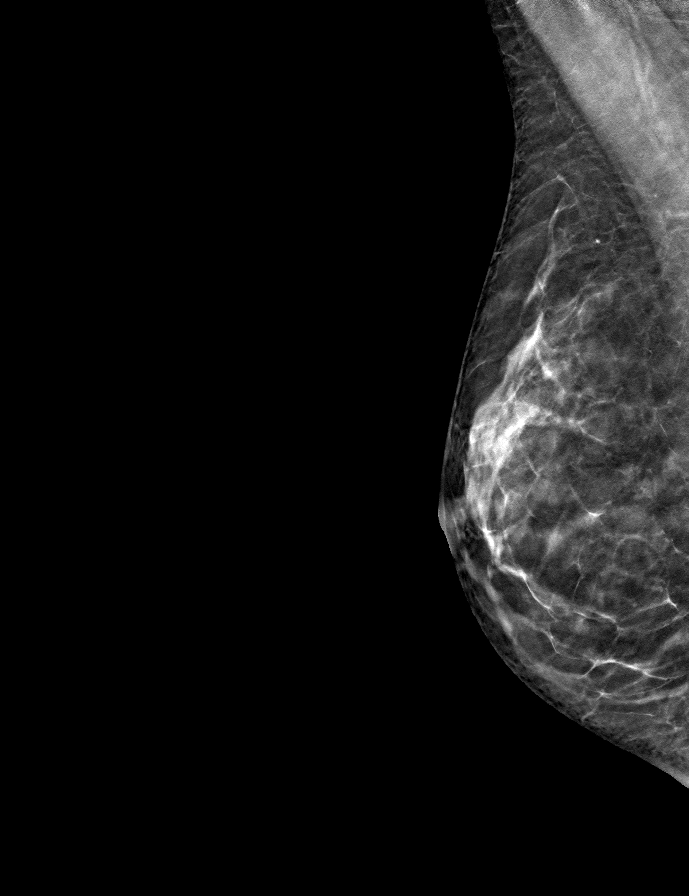

[L MLO tomo · tomo slice 31/60.0]
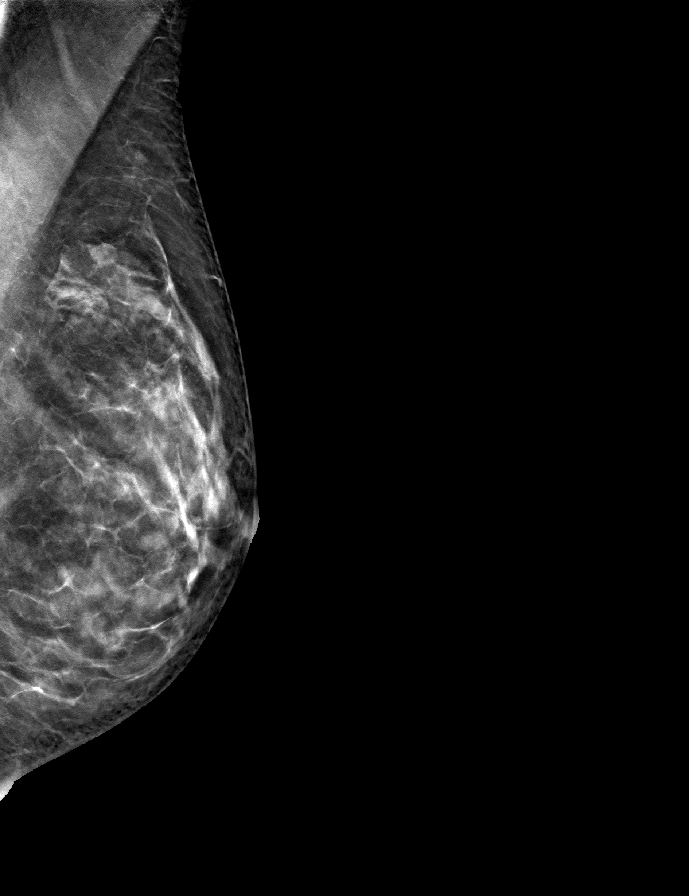

[9 of 24 positions shown; findings below may reference images not displayed]

ACR Breast Density Category c: The breast tissue is heterogeneously
dense, which may obscure small masses.
FINDINGS: There are no findings suspicious for malignancy.
IMPRESSION: No mammographic evidence of malignancy. A result letter of this
screening mammogram will be mailed directly to the patient.

RECOMMENDATION:
Screening mammogram in one year. (Code:Q3-W-BC3)

BI-RADS CATEGORY  1: Negative.

## 2022-07-01 NOTE — Progress Notes (Unsigned)
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  She has no history of hypertension and no underlying structural mass lesion or vascular abnormality.  She has been steadily improving.      Follow up in 6 months  Subjective:  Samantha Wiley is a 62 year old right-handed female with osteoporosis who follows up for hemorrhagic stroke.  UPDATE: Overall, she is doing well.  Her visual deficits haven't completely resolved.  She notes blurred vision that resolves when closing either eye.  Sometimes she feels a little dizzy.  She has an upcoming appointment with neuro-ophthalmology in May to be considered for prisms.   HISTORY: On 09/14/2021, patient was in [place], [place] when she developed headache and speech abnormality.  She has no recollection of the event or the hospital stay.  She was admitted to [facility] where she was found to have a non-traumatic left thalamic/basal ganglia hemorrhage with interventricular hemorrhage requiring intubation an EVD placement and subsequent removal on 4/19.  She has no history of high blood pressure (she actually has low blood pressure) and there is no mention in available notes that she presented with elevated blood pressure.  MRI of brain performed in hospital revealed no underlying lesion but was performed without contrast.  She was discharged on 10/02/2021 to [facility] where she stayed until 10/12/2021.  She followed up with local outpatient neurosurgery on 10/15/2021 a day after repeat head CT which showed unchanged hypodensity in the left thalamus, normal extra-axial spaces and a small calcification along the external ventricular drain tract with right frontal approach but no mass, hemorrhage or midline shift.  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  If she closes her left eye, vision is clear but if she just closes her right eye, still looks a little blurred.  With eye movements up or side to side, notes double vision.  She saw neuro-ophthalmology who told her that her vision will eventually resolve.  She had a follow up outpatient MRI of brain with and without contrast on 12/18/2021 which showed sequela of prior left thalamic hemorrhage without underlying lesion.  She noticed mild left sided facial tingling in the V2 distribution.  She also continues to feel unsteady on her feet and dizzy.  She is not driving.   09/14/2021 CT HEAD WO:  Left thalamic intraparenchymal hemorrhage with extension into the ventricles.  This extends down to the 4th ventricle with ventricular enlargement. 09/17/2021 MRI BRAIN WO:  Redemonstrated findings from CT, as above. 09/23/2021 CT HEAD WO:  Continued evolution of the left thalamic intraparenchymal hemorrhage.  Resolved subarachnoid hemorrhage.  09/24/2021 CT HEAD WO:  Continued left thalamic intraparenchymal hemorrhagic evolution, similar ventricular size with small volume intraventricular blood.  No hydrocephalus with EVD in position.  No new hemorrhage.  09/29/2021 CT HEAD WO:  Continued left thalamic intraparenchymal hemorrhagic evolusion.  Similar ventricular size status post EVD removal.  No new hemorrhage or other acute interval finding. 12/18/2021 MRI BRAIN W WO:  1. Sequela of prior left thalamic hemorrhage. No underlying lesion identified. 2. 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   PAST MEDICAL HISTORY: Past Medical History:  Diagnosis Date   Anxiety    situational anxiety   GERD (gastroesophageal reflux disease)    OTC  PRN meds   Osteoporosis    q58monthProlia inj   Seasonal allergies    [place]   subarachnoid hemorrhage-eye sight (blurriness)- not able to drive due to this     MEDICATIONS: Current Outpatient Medications on File Prior to Visit  Medication Sig Dispense Refill   acetaminophen (TYLENOL) 500 MG tablet Take 500 mg by mouth every 6 (six) hours as needed for moderate pain.     Ascorbic Acid (VITAMIN C) 100 MG CHEW Chew 282 mg by mouth daily at 6 (six) AM.     calcium carbonate (TUMS - DOSED IN MG ELEMENTAL CALCIUM) 500 MG chewable tablet Chew 1 tablet by mouth daily as needed for indigestion or heartburn.     Cholecalciferol (VITAMIN D-1000 MAX ST) 25 MCG (1000 UT) tablet Take 2 tablets by mouth daily at 6 (six) AM.     denosumab (PROLIA) 60 MG/ML SOSY injection Inject 60 mg into the skin every 6 (six) months.     fluticasone (FLONASE) 50 MCG/ACT nasal spray Place 2 sprays into both nostrils daily.     Magnesium 200 MG TABS Take 1 tablet by mouth daily at 6 (six) AM.     Multiple Vitamins-Minerals (MULTI ADULT GUMMIES) CHEW Chew 1 tablet by mouth daily.     omeprazole (PRILOSEC) 20 MG capsule Take 20 mg by mouth daily as needed.     vitamin B-12 (CYANOCOBALAMIN) 1000 MCG tablet Take 1 tablet by mouth daily.     No current facility-administered medications on file prior to visit.    ALLERGIES: No Known Allergies  FAMILY HISTORY: Family History  Problem Relation Age of Onset   Colon polyps Mother 768  Stroke Maternal Grandfather    Stroke Paternal Grandmother    Colon cancer Neg Hx    Esophageal cancer Neg Hx    Stomach cancer Neg Hx    Rectal cancer Neg Hx       Objective:  Blood pressure 121/77, pulse 62, resp. rate 18, height '5\' 5"'$  (1.651 m), weight 98 lb (44.5 kg), SpO2 98 %. General: No acute distress.  Patient appears well-groomed.   Head:  Normocephalic/atraumatic Eyes:  Fundi examined but not visualized Neck: supple, no paraspinal tenderness, full range of motion Heart:  Regular rate and rhythm Lungs:  Clear to auscultation bilaterally Back: No paraspinal tenderness Neurological Exam: alert and oriented to person, place, and  time.  Speech fluent and not dysarthric, language intact.  CN II-XII intact. Bulk and tone normal, muscle strength 5/5 throughout.  Sensation to light touch intact.  Deep tendon reflexes 2+ throughout, toes downgoing.  Finger to nose testing intact.  Gait normal, able to tandem walk.  Romberg negative.   AMetta Clines DO  CC: JBlenda Mounts MD

## 2022-07-03 ENCOUNTER — Encounter: Payer: Self-pay | Admitting: Neurology

## 2022-07-03 ENCOUNTER — Ambulatory Visit: Payer: BC Managed Care – PPO | Admitting: Neurology

## 2022-07-03 VITALS — BP 121/77 | HR 62 | Resp 18 | Ht 65.0 in | Wt 98.0 lb

## 2022-07-03 DIAGNOSIS — I619 Nontraumatic intracerebral hemorrhage, unspecified: Secondary | ICD-10-CM

## 2022-07-06 NOTE — Patient Instructions (Signed)
Follow up in 6 months 

## 2022-12-31 NOTE — Progress Notes (Signed)
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  She has no history of hypertension and no underlying structural mass lesion or vascular abnormality.    Clinically stable.  Follow up as needed.    Subjective:  Samantha Wiley is a 62 year old right-handed female with osteoporosis who follows up for hemorrhagic stroke.  UPDATE: Overall, symptoms improved but have leveled off.  She still has double vision with upward gaze.  She saw the orthoptist who has prescribed her prisms.    HISTORY: On 09/14/2021, patient was in [place], [place] when she developed headache and speech abnormality.  She has no recollection of the event or the hospital stay.  She was admitted to [facility] where she was found to have a non-traumatic left thalamic/basal ganglia hemorrhage with interventricular hemorrhage requiring intubation an EVD placement and subsequent removal on 4/19.  She has no history of high blood pressure (she actually has low blood pressure) and there is no mention in available notes that she presented with elevated blood pressure.  MRI of brain performed in hospital revealed no underlying lesion but was performed without contrast.  She was discharged on 10/02/2021 to [facility] where she stayed until 10/12/2021.  She followed up with local outpatient neurosurgery on 10/15/2021 a day after repeat head CT which showed unchanged hypodensity in the left thalamus, normal extra-axial spaces and a small calcification along the external ventricular drain tract with right frontal approach but no mass, hemorrhage or midline shift.  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  If she closes her left eye, vision is clear but if she just closes her right eye, still looks a little  blurred.  With eye movements up or side to side, notes double vision.  She saw neuro-ophthalmology who told her that her vision will eventually resolve.  She had a follow up outpatient MRI of brain with and without contrast on 12/18/2021 which showed sequela of prior left thalamic hemorrhage without underlying lesion.  She noticed mild left sided facial tingling in the V2 distribution.  She also continues to feel unsteady on her feet and dizzy.  She is not driving.   09/14/2021 CT HEAD WO:  Left thalamic intraparenchymal hemorrhage with extension into the ventricles.  This extends down to the 4th ventricle with ventricular enlargement. 09/17/2021 MRI BRAIN WO:  Redemonstrated findings from CT, as above. 09/23/2021 CT HEAD WO:  Continued evolution of the left thalamic intraparenchymal hemorrhage.  Resolved subarachnoid hemorrhage.  09/24/2021 CT HEAD WO:  Continued left thalamic intraparenchymal hemorrhagic evolution, similar ventricular size with small volume intraventricular blood.  No hydrocephalus with EVD in position.  No new hemorrhage.  09/29/2021 CT HEAD WO:  Continued left thalamic intraparenchymal hemorrhagic evolusion.  Similar ventricular size status post EVD removal.  No new hemorrhage or other acute interval finding. 12/18/2021 MRI BRAIN W WO:  1. Sequela of prior left thalamic hemorrhage. No underlying lesion identified. 2. 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   PAST MEDICAL HISTORY: Past Medical History:  Diagnosis Date   Anxiety    situational anxiety   GERD (gastroesophageal reflux disease)    OTC PRN meds   Osteoporosis    q73month-Prolia inj   Seasonal allergies    Stroke ([facility]) 09/2021  subarachnoid hemorrhage-eye sight (blurriness)- not able to drive due to this    MEDICATIONS: Current Outpatient Medications on File Prior to Visit  Medication Sig Dispense Refill   acetaminophen (TYLENOL) 500  MG tablet Take 500 mg by mouth every 6 (six) hours as needed for moderate pain.     Ascorbic Acid (VITAMIN C) 100 MG CHEW Chew 282 mg by mouth daily at 6 (six) AM.     calcium carbonate (TUMS - DOSED IN MG ELEMENTAL CALCIUM) 500 MG chewable tablet Chew 1 tablet by mouth daily as needed for indigestion or heartburn.     Cholecalciferol (VITAMIN D-1000 MAX ST) 25 MCG (1000 UT) tablet Take 2 tablets by mouth daily at 6 (six) AM.     denosumab (PROLIA) 60 MG/ML SOSY injection Inject 60 mg into the skin every 6 (six) months.     fluticasone (FLONASE) 50 MCG/ACT nasal spray Place 2 sprays into both nostrils daily.     Magnesium 200 MG TABS Take 1 tablet by mouth daily at 6 (six) AM.     Multiple Vitamins-Minerals (MULTI ADULT GUMMIES) CHEW Chew 1 tablet by mouth daily.     omeprazole (PRILOSEC) 20 MG capsule Take 20 mg by mouth daily as needed.     vitamin B-12 (CYANOCOBALAMIN) 1000 MCG tablet Take 1 tablet by mouth daily.     No current facility-administered medications on file prior to visit.    ALLERGIES: No Known Allergies  FAMILY HISTORY: Family History  Problem Relation Age of Onset   Colon polyps Mother 40   Stroke Maternal Grandfather    Stroke Paternal Grandmother    Colon cancer Neg Hx    Esophageal cancer Neg Hx    Stomach cancer Neg Hx    Rectal cancer Neg Hx       Objective:  Blood pressure 95/60, pulse 77, height 5\' 5"  (1.651 m), weight 95 lb (43.1 kg), SpO2 96%. General: No acute distress.  Patient appears well-groomed.   Head:  Normocephalic/atraumatic Eyes:  Fundi examined but not visualized Neck: supple, no paraspinal tenderness, full range of motion Heart:  Regular rate and rhythm Neurological Exam: alert and oriented.  Speech fluent and not dysarthric, language intact.  Reduced left upward gaze with torsional nystagmus in right eye.  Otherwise, CN II-XII intact. Bulk and tone normal, muscle strength 5/5 throughout.  Sensation to pinprick and vibration intact.  Deep  tendon reflexes 2+ throughout.  Finger to nose testing intact.  Gait normal, Romberg negative.    Shon Millet, DO  CC: Josiah Lobo, MD

## 2023-01-01 ENCOUNTER — Encounter: Payer: Self-pay | Admitting: Neurology

## 2023-01-01 ENCOUNTER — Ambulatory Visit: Payer: BC Managed Care – PPO | Admitting: Neurology

## 2023-01-01 VITALS — BP 95/60 | HR 77 | Ht 65.0 in | Wt 95.0 lb

## 2023-01-01 DIAGNOSIS — H5022 Vertical strabismus, left eye: Secondary | ICD-10-CM | POA: Diagnosis not present

## 2023-01-01 DIAGNOSIS — I619 Nontraumatic intracerebral hemorrhage, unspecified: Secondary | ICD-10-CM

## 2023-01-01 NOTE — Patient Instructions (Signed)
Follow up as needed

## 2023-01-28 ENCOUNTER — Other Ambulatory Visit: Payer: Self-pay | Admitting: Medical Genetics

## 2023-01-28 DIAGNOSIS — Z006 Encounter for examination for normal comparison and control in clinical research program: Secondary | ICD-10-CM

## 2023-08-11 ENCOUNTER — Other Ambulatory Visit: Payer: Self-pay | Admitting: Family Medicine

## 2023-08-11 DIAGNOSIS — Z0000 Encounter for general adult medical examination without abnormal findings: Secondary | ICD-10-CM

## 2023-08-18 ENCOUNTER — Ambulatory Visit
Admission: RE | Admit: 2023-08-18 | Discharge: 2023-08-18 | Disposition: A | Discharge status: Home / Self Care | Source: Ambulatory Visit | Attending: Obstetrics | Admitting: Obstetrics

## 2023-08-18 DIAGNOSIS — Z0000 Encounter for general adult medical examination without abnormal findings: Secondary | ICD-10-CM
